# Patient Record
Sex: Female | Born: 1990 | Race: White | Hispanic: No | Marital: Single | Smoking: Former smoker
Health system: Southern US, Community
[De-identification: ages and names within clinical notes are randomized; demographics above are authoritative.]

## PROBLEM LIST (undated history)

## (undated) DIAGNOSIS — F32A Depression, unspecified: Secondary | ICD-10-CM

## (undated) DIAGNOSIS — A609 Anogenital herpesviral infection, unspecified: Secondary | ICD-10-CM

## (undated) DIAGNOSIS — F419 Anxiety disorder, unspecified: Secondary | ICD-10-CM

## (undated) DIAGNOSIS — O24419 Gestational diabetes mellitus in pregnancy, unspecified control: Secondary | ICD-10-CM

## (undated) HISTORY — DX: Anogenital herpesviral infection, unspecified: A60.9

## (undated) HISTORY — DX: Depression, unspecified: F32.A

## (undated) HISTORY — DX: Gestational diabetes mellitus in pregnancy, unspecified control: O24.419

## (undated) HISTORY — DX: Anxiety disorder, unspecified: F41.9

## (undated) HISTORY — PX: TONSILLECTOMY: SUR1361

---
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
## 2010-03-22 NOTE — Procedures (Signed)
Summary: Gastroenterology endo  Gastroenterology endo   Imported By: Donneta Romberg 05/05/2007 08:32:35  _____________________________________________________________________  External Attachment:    Type:   Image     Comment:   External Document

## 2010-07-03 NOTE — Assessment & Plan Note (Signed)
[facility]                         GASTROENTEROLOGY OFFICE NOTE   CYNTIA, STALEY                       MRN:          [id number]  DATE:11/24/2006                            DOB:          10/15/1990    PROBLEM:  Nausea.   Ms. Lemus is an 20 year old white female referred through the  courtesy of the physicians at [facility] for evaluation.  For at least  the last 7-10 days, she has been complaining of nausea, particularly  upon awakening.  She has had right epigastric discomfort with pyrosis  and stabbing pain left upper quadrant.  She was started on Prevacid with  some improvement in her symptoms, though they remain.  She denies  dysphagia.  She is on no gastric irritants with nonsteroidals.  With her  upper GI symptoms, she has had erratic bowels consisting of alternating  diarrhea and constipation.  There is no history of melena.  On one  occasion, she had some blood on the toilet tissue.   PAST MEDICAL HISTORY:  Pertinent for anxiety and depression.   FAMILY HISTORY:  Noncontributory.   MEDICATIONS:  Prevacid 30 mg a day.   ALLERGIES:  None.   SOCIAL HISTORY:  She neither smokes nor drinks.  She is single and lives  with her grandmother.   REVIEW OF SYSTEMS:  Positive for anxiety, shortness of breath, and  insomnia.   PHYSICAL EXAMINATION:  VITAL SIGNS:  Pulse 84, blood pressure 118/78,  weight 240.  HEENT:  EOMI. PERRLA.  Sclerae are anicteric. Conjunctivae are pink.  NECK:  Supple without thyromegaly, adenopathy or carotid bruits.  CHEST:  Clear to auscultation and percussion without adventitious  sounds.  CARDIAC:  Regular rhythm; normal S1 S2.  There are no murmurs, gallops  or rubs.  ABDOMEN:  Bowel sounds are normoactive.  Abdomen is soft, nontender and  nondistended.  There are no abdominal masses, tenderness, splenic  enlargement or hepatomegaly.  EXTREMITIES:  Full range of motion.  No cyanosis, clubbing or edema.  RECTAL:  Deferred.   IMPRESSION:  1. Nausea and dyspepsia.  This could be due to acid reflux.      Ulcerative and nonulcerative dyspepsia is another consideration.  2. Irritable bowel syndrome.   RECOMMENDATIONS:  1. Switch Prevacid to 30 mg 1/2 hour before dinnertime.  2. Upper endoscopy.     Barbette Hair. Arlyce Dice, MD,FACG  Electronically Signed    RDK/MedQ  DD: 11/24/2006  DT: 11/24/2006  Job #: [id number]

## 2011-03-12 ENCOUNTER — Ambulatory Visit (INDEPENDENT_AMBULATORY_CARE_PROVIDER_SITE_OTHER): Payer: 59

## 2011-03-12 DIAGNOSIS — S61409A Unspecified open wound of unspecified hand, initial encounter: Secondary | ICD-10-CM

## 2011-03-12 DIAGNOSIS — M79609 Pain in unspecified limb: Secondary | ICD-10-CM

## 2011-03-12 DIAGNOSIS — Z23 Encounter for immunization: Secondary | ICD-10-CM

## 2012-10-14 ENCOUNTER — Ambulatory Visit (INDEPENDENT_AMBULATORY_CARE_PROVIDER_SITE_OTHER): Payer: 59 | Admitting: Women's Health

## 2012-10-14 ENCOUNTER — Encounter: Payer: Self-pay | Admitting: Women's Health

## 2012-10-14 VITALS — BP 110/75 | Ht 65.0 in | Wt 244.0 lb

## 2012-10-14 DIAGNOSIS — Z113 Encounter for screening for infections with a predominantly sexual mode of transmission: Secondary | ICD-10-CM

## 2012-10-14 DIAGNOSIS — Z01419 Encounter for gynecological examination (general) (routine) without abnormal findings: Secondary | ICD-10-CM

## 2012-10-14 DIAGNOSIS — Z833 Family history of diabetes mellitus: Secondary | ICD-10-CM

## 2012-10-14 DIAGNOSIS — A499 Bacterial infection, unspecified: Secondary | ICD-10-CM

## 2012-10-14 DIAGNOSIS — N760 Acute vaginitis: Secondary | ICD-10-CM

## 2012-10-14 DIAGNOSIS — N898 Other specified noninflammatory disorders of vagina: Secondary | ICD-10-CM

## 2012-10-14 DIAGNOSIS — Z975 Presence of (intrauterine) contraceptive device: Secondary | ICD-10-CM

## 2012-10-14 DIAGNOSIS — B9689 Other specified bacterial agents as the cause of diseases classified elsewhere: Secondary | ICD-10-CM

## 2012-10-14 LAB — CBC WITH DIFFERENTIAL/PLATELET
Basophils Absolute: 0 10*3/uL (ref 0.0–0.1)
Basophils Relative: 0 % (ref 0–1)
Eosinophils Absolute: 0.2 10*3/uL (ref 0.0–0.7)
Eosinophils Relative: 2 % (ref 0–5)
HCT: 36.9 % (ref 36.0–46.0)
Hemoglobin: 12.7 g/dL (ref 12.0–15.0)
Lymphocytes Relative: 22 % (ref 12–46)
Lymphs Abs: 1.8 10*3/uL (ref 0.7–4.0)
MCH: 30.2 pg (ref 26.0–34.0)
MCHC: 34.4 g/dL (ref 30.0–36.0)
MCV: 87.9 fL (ref 78.0–100.0)
Monocytes Absolute: 0.6 10*3/uL (ref 0.1–1.0)
Monocytes Relative: 7 % (ref 3–12)
Neutro Abs: 5.8 10*3/uL (ref 1.7–7.7)
Neutrophils Relative %: 69 % (ref 43–77)
Platelets: 295 10*3/uL (ref 150–400)
RBC: 4.2 MIL/uL (ref 3.87–5.11)
RDW: 13.9 % (ref 11.5–15.5)
WBC: 8.4 10*3/uL (ref 4.0–10.5)

## 2012-10-14 LAB — WET PREP FOR TRICH, YEAST, CLUE
Trich, Wet Prep: NONE SEEN
Yeast Wet Prep HPF POC: NONE SEEN

## 2012-10-14 LAB — HEPATITIS B SURFACE ANTIGEN: Hepatitis B Surface Ag: NEGATIVE

## 2012-10-14 LAB — RPR

## 2012-10-14 LAB — HEPATITIS C ANTIBODY: HCV Ab: NEGATIVE

## 2012-10-14 LAB — GLUCOSE, RANDOM: Glucose, Bld: 105 mg/dL — ABNORMAL HIGH (ref 70–99)

## 2012-10-14 LAB — HIV ANTIBODY (ROUTINE TESTING W REFLEX): HIV: NONREACTIVE

## 2012-10-14 MED ORDER — METRONIDAZOLE 0.75 % VA GEL: VAGINAL | Status: DC

## 2012-10-14 NOTE — Progress Notes (Signed)
Rhona Fusilier 1991-01-31 [id number]    History:    New patient presents for annual exam and problem. States had left lower quadrant pain 2 weeks ago, resolved. Process of moving and wanted to be examined prior to move. Skyla IUD placed July 2013 at [facility]. Monthly cycle for 5 days, lighter since IUD. New partner. Unsure if received gardasil.   Past medical history, past surgical history, family history and social history were all reviewed and documented in the EPIC chart. Weight stable, obese. Parents healthy.  ROS:  A  ROS was performed and pertinent positives and negatives are included in the history.  Exam:  Filed Vitals:   10/14/12 1019  BP: 110/75    General appearance:  Normal Head/Neck:  Normal, without cervical or supraclavicular adenopathy. Thyroid:  Symmetrical, normal in size, without palpable masses or nodularity. Respiratory  Effort:  Normal  Auscultation:  Clear without wheezing or rhonchi Cardiovascular  Auscultation:  Regular rate, without rubs, murmurs or gallops  Edema/varicosities:  Not grossly evident Abdominal  Soft,nontender, without masses, guarding or rebound.  Liver/spleen:  No organomegaly noted  Hernia:  None appreciated  Skin  Inspection:  Grossly normal  Palpation:  Grossly normal Neurologic/psychiatric  Orientation:  Normal with appropriate conversation.  Mood/affect:  Normal  Genitourinary    Breasts: Examined lying and sitting.     Right: Without masses, retractions, discharge or axillary adenopathy.     Left: Without masses, retractions, discharge or axillary adenopathy.   Inguinal/mons:  Normal without inguinal adenopathy  External genitalia:  Normal  BUS/Urethra/Skene's glands:  Normal  Bladder:  Normal  Vagina: Wet prep positive for amines, clues, and TNTC bacteria  Cervix:  Normal IUD strings visible  Uterus:   normal in size, shape and contour.  Midline and mobile  Adnexa/parametria:     Rt: Without masses or  tenderness.   Lt: Without masses or tenderness.  Anus and perineum: Normal  Digital rectal exam: Normal sphincter tone without palpated masses or tenderness  Assessment/Plan:  22 y.o. S. WF G0 for annual exam.     Left lower quadrant pain resolved Skyla IUD placed 08/2011 Obesity STD screen Bacteria vaginosis  Plan: MetroGel vaginal cream 1 applicator at bedtime x5, alcohol precautions reviewed. Instructed to call or return if left lower quadrant pain returns. SBE's, calcium rich diet, MVI daily encouraged. CBC, glucose, UA, GC/Chlamydia, HIV, hep B, C., RPR. Pap normal 2013, new screening guidelines reviewed. Instructed to find out if she received gardasil return to office if needed. Reviewed importance of increasing exercise and decreasing calories for weight loss for health.     Harrington Challenger [facility], 10:52 AM 10/14/2012

## 2012-10-14 NOTE — Patient Instructions (Signed)

## 2012-10-15 LAB — GC/CHLAMYDIA PROBE AMP
CT Probe RNA: NEGATIVE
GC Probe RNA: NEGATIVE

## 2012-10-16 LAB — URINALYSIS W MICROSCOPIC + REFLEX CULTURE
Bacteria, UA: NONE SEEN
Bilirubin Urine: NEGATIVE
Casts: NONE SEEN
Crystals: NONE SEEN
Glucose, UA: NEGATIVE mg/dL
Hgb urine dipstick: NEGATIVE
Ketones, ur: NEGATIVE mg/dL
Leukocytes, UA: NEGATIVE
Nitrite: NEGATIVE
Protein, ur: NEGATIVE mg/dL
Specific Gravity, Urine: 1.023 (ref 1.005–1.030)
Squamous Epithelial / LPF: NONE SEEN
Urobilinogen, UA: 0.2 mg/dL (ref 0.0–1.0)
pH: 5.5 (ref 5.0–8.0)

## 2012-12-24 ENCOUNTER — Other Ambulatory Visit: Payer: Self-pay

## 2013-12-02 ENCOUNTER — Encounter: Payer: 59 | Admitting: Women's Health

## 2013-12-16 ENCOUNTER — Encounter: Payer: 59 | Admitting: Women's Health

## 2013-12-27 ENCOUNTER — Ambulatory Visit (INDEPENDENT_AMBULATORY_CARE_PROVIDER_SITE_OTHER): Payer: BC Managed Care – PPO | Admitting: Gynecology

## 2013-12-27 ENCOUNTER — Encounter: Payer: Self-pay | Admitting: Gynecology

## 2013-12-27 VITALS — BP 128/82

## 2013-12-27 DIAGNOSIS — R102 Pelvic and perineal pain: Secondary | ICD-10-CM

## 2013-12-27 DIAGNOSIS — Z87448 Personal history of other diseases of urinary system: Secondary | ICD-10-CM

## 2013-12-27 DIAGNOSIS — Z8742 Personal history of other diseases of the female genital tract: Secondary | ICD-10-CM

## 2013-12-27 DIAGNOSIS — Z113 Encounter for screening for infections with a predominantly sexual mode of transmission: Secondary | ICD-10-CM

## 2013-12-27 DIAGNOSIS — Z87898 Personal history of other specified conditions: Secondary | ICD-10-CM

## 2013-12-27 DIAGNOSIS — Z30432 Encounter for removal of intrauterine contraceptive device: Secondary | ICD-10-CM

## 2013-12-27 DIAGNOSIS — N898 Other specified noninflammatory disorders of vagina: Secondary | ICD-10-CM

## 2013-12-27 LAB — WET PREP FOR TRICH, YEAST, CLUE: Trich, Wet Prep: NONE SEEN

## 2013-12-27 MED ORDER — KETOROLAC TROMETHAMINE 10 MG PO TABS: 10.0000 mg | ORAL_TABLET | Freq: Four times a day (QID) | ORAL | Status: DC | PRN

## 2013-12-27 MED ORDER — FLUCONAZOLE 150 MG PO TABS: 150.0000 mg | ORAL_TABLET | Freq: Once | ORAL | Status: DC

## 2013-12-27 MED ORDER — KETOROLAC TROMETHAMINE 60 MG/2ML IM SOLN
60.0000 mg | Freq: Once | INTRAMUSCULAR | Status: AC
  Administered 2013-12-27: 60 mg via INTRAMUSCULAR

## 2013-12-27 MED ORDER — DOXYCYCLINE HYCLATE 100 MG PO CAPS: 100.0000 mg | ORAL_CAPSULE | Freq: Two times a day (BID) | ORAL | Status: DC

## 2013-12-27 NOTE — Progress Notes (Signed)
   Patient is a 23 year old who presented to the office today for follow-up. She had presented to [facility] on November 7 complaining of not being able to void. Patient stated that had done an ultrasound and then she was released home and then returned back she still couldn't void and they ended up putting a Foley catheter and was asked to follow-up with her gynecologist today and that's why she is here. We're having difficulty trying to obtain her medical records from her CT and ultrasound and lab results. The only information we are able to obtain was a CT scan which they report the following:  Radiology interpretation: 1. No renal calculi or hydronephrosis, Foley catheter urinary bladder 2. Shotty iliac chain lymph nodes with nonspecific mildly enlarged distal right external iliac lymphadenopathy, largest one measuring 2.2 x 1.5 cm. 3. IUD in place and no adnexal masses  They gave her Rocephin IM and start her on cephalexin 500 mg 4 times a day. Patient denies any fever, chills, nausea or vomiting. She did state that they did do a urine pregnancy test and was negative. She does have a Skla IUD that was placed 1-1/2 years ago and reports having normal menstrual cycle. She does state that she had a new sexual partner of 2 months.  Exam: Patient was fully Santina Evansatherine place Back: No CVA tenderness Abdomen: Soft nontender no rebound or guarding Negative Rovsing negative obturator negative heel tap sign Pelvic: Bartholin urethra Skene was within normal limits Vagina: No lesions or discharge Cervix: IUD string visualized Uterus: Anteverted normal size shape and consistency Adnexa: No palpable mass or tenderness Rectal exam deferred  Foley catheter was removed urine was submitted for culture  The IUD string was grasped with a Bo[place]eman clamp retrieved and discarded after showing to the patient.  Wet prep few yeast  Assessment/plan: Unclear of working diagnosis from  this weeks visit at the emergency room. Present working diagnosis with pelvic inflammatory disease. We did repeat a GC and Chlamydia culture today we are going to try to obtain the lab work that was done recently as well as the ultrasound. For broader coverage patient will be prescribed Vibramycin 100 mg one by mouth twice a day for 7 days. She received Toradol 60 mg IM in the office today and will continue with 10 mg oral tablet every 6 hours when necessary for the next few days. She'll return back to the office later this week for an ultrasound. If she is unable to void within the next 5-6 hours she'll report back to the emergency room. She had no clinical evidence of PID on exam today pelvic exam essentially unremarkable. The CT of the abdomen and pelvis the only description of the female genital tract was IUD was in the uterus and no adnexal masses were noted. Patient will abstain from intercourse until evaluations completed.

## 2013-12-27 NOTE — Patient Instructions (Signed)
Pelvic Inflammatory Disease °Pelvic inflammatory disease (PID) refers to an infection in some or all of the female organs. The infection can be in the uterus, ovaries, fallopian tubes, or the surrounding tissues in the pelvis. PID can cause abdominal or pelvic pain that comes on suddenly (acute pelvic pain). PID is a serious infection because it can lead to lasting (chronic) pelvic pain or the inability to have children (infertile).  °CAUSES  °The infection is often caused by the normal bacteria found in the vaginal tissues. PID may also be caused by an infection that is spread during sexual contact. PID can also occur following:  °· The birth of a baby.   °· A miscarriage.   °· An abortion.   °· Major pelvic surgery.   °· The use of an intrauterine device (IUD).   °· A sexual assault.   °RISK FACTORS °Certain factors can put a person at higher risk for PID, such as: °· Being younger than 25 years. °· Being sexually active at a young age. °· Using nonbarrier contraception. °· Having multiple sexual partners. °· Having sex with someone who has symptoms of a genital infection. °· Using oral contraception. °Other times, certain behaviors can increase the possibility of getting PID, such as: °· Having sex during your period. °· Using a vaginal douche. °· Having an intrauterine device (IUD) in place. °SYMPTOMS  °· Abdominal or pelvic pain.   °· Fever.   °· Chills.   °· Abnormal vaginal discharge. °· Abnormal uterine bleeding.   °· Unusual pain shortly after finishing your period. °DIAGNOSIS  °Your caregiver will choose some of the following methods to make a diagnosis, such as:  °· Performing a physical exam and history. A pelvic exam typically reveals a very tender uterus and surrounding pelvis.   °· Ordering laboratory tests including a pregnancy test, blood tests, and urine test.  °· Ordering cultures of the vagina and cervix to check for a sexually transmitted infection (STI). °· Performing an ultrasound.    °· Performing a laparoscopic procedure to look inside the pelvis.   °TREATMENT  °· Antibiotic medicines may be prescribed and taken by mouth.   °· Sexual partners may be treated when the infection is caused by a sexually transmitted disease (STD).   °· Hospitalization may be needed to give antibiotics intravenously. °· Surgery may be needed, but this is rare. °It may take weeks until you are completely well. If you are diagnosed with PID, you should also be checked for human immunodeficiency virus (HIV).   °HOME CARE INSTRUCTIONS  °· If given, take your antibiotics as directed. Finish the medicine even if you start to feel better.   °· Only take over-the-counter or prescription medicines for pain, discomfort, or fever as directed by your caregiver.   °· Do not have sexual intercourse until treatment is completed or as directed by your caregiver. If PID is confirmed, your recent sexual partner(s) will need treatment.   °· Keep your follow-up appointments. °SEEK MEDICAL CARE IF:  °· You have increased or abnormal vaginal discharge.   °· You need prescription medicine for your pain.   °· You vomit.   °· You cannot take your medicines.   °· Your partner has an STD.   °SEEK IMMEDIATE MEDICAL CARE IF:  °· You have a fever.   °· You have increased abdominal or pelvic pain.   °· You have chills.   °· You have pain when you urinate.   °· You are not better after 72 hours following treatment.   °MAKE SURE YOU:  °· Understand these instructions. °· Will watch your condition. °· Will get help right away if you are not doing well or get worse. °  Document Released: 02/04/2005 Document Revised: 06/01/2012 Document Reviewed: 01/31/2011 °Exit[employer]® Patient Information ©2015 [employer]. This information is not intended to replace advice given to you by your health care provider. Make sure you discuss any questions you have with your health care provider. ° °

## 2013-12-28 LAB — GC/CHLAMYDIA PROBE AMP
CT Probe RNA: NEGATIVE
GC Probe RNA: NEGATIVE

## 2013-12-29 ENCOUNTER — Other Ambulatory Visit: Payer: Self-pay | Admitting: Gynecology

## 2013-12-29 ENCOUNTER — Ambulatory Visit (INDEPENDENT_AMBULATORY_CARE_PROVIDER_SITE_OTHER): Payer: BC Managed Care – PPO | Admitting: Gynecology

## 2013-12-29 ENCOUNTER — Ambulatory Visit (INDEPENDENT_AMBULATORY_CARE_PROVIDER_SITE_OTHER): Payer: BC Managed Care – PPO

## 2013-12-29 ENCOUNTER — Encounter: Payer: Self-pay | Admitting: Gynecology

## 2013-12-29 DIAGNOSIS — Z87448 Personal history of other diseases of urinary system: Secondary | ICD-10-CM

## 2013-12-29 DIAGNOSIS — L680 Hirsutism: Secondary | ICD-10-CM

## 2013-12-29 DIAGNOSIS — Z87898 Personal history of other specified conditions: Secondary | ICD-10-CM

## 2013-12-29 DIAGNOSIS — R102 Pelvic and perineal pain: Secondary | ICD-10-CM

## 2013-12-29 DIAGNOSIS — R339 Retention of urine, unspecified: Secondary | ICD-10-CM

## 2013-12-29 DIAGNOSIS — E282 Polycystic ovarian syndrome: Secondary | ICD-10-CM

## 2013-12-29 DIAGNOSIS — Z8742 Personal history of other diseases of the female genital tract: Secondary | ICD-10-CM

## 2013-12-29 LAB — URINE CULTURE
Colony Count: NO GROWTH
Organism ID, Bacteria: NO GROWTH

## 2013-12-29 NOTE — Progress Notes (Signed)
   Patient presented to the office today for follow-up. She was seen in the office 48 hours ago see previous note dated November 9 for details. Her Foley catheter was removed. Patient's 12 well. She still taken her cephalexin but has not started the Vibramycin for which she will start today for completion of therapy for suspected PID based on the information that I got from this previous facilitating where she was seen. Her IUD was removed. She is totally asymptomatic today. Her recent GC and Chlamydia culture done in our office was negative. After Foley catheter was removed 2 days ago we ran a urine culture which was negative. She is here for ultrasound since we did not have an ultrasound report when she was in the emergency room [facility] on November 7.  Ultrasound today: Uterus measures 7.6 x 5.1 x 3.6 cm. Right and left ovary were normal. Otherwise unremarkable with no masses seen. Post void residual urine was noted.  Assessment/plan: Patient treated for suspected PID doing well. Patient is not going to be sexually active for the next few weeks. She will use para contraception alternative forms of contraception were discussed. Patient will otherwise return back to the office for annual exam or when necessary. If she has any further voiding problems she'll contact the office and we will refer to the urologist.

## 2013-12-29 NOTE — Patient Instructions (Signed)

## 2014-01-12 ENCOUNTER — Other Ambulatory Visit: Payer: Self-pay | Admitting: Gynecology

## 2014-01-31 ENCOUNTER — Other Ambulatory Visit: Payer: Self-pay | Admitting: Gynecology

## 2014-01-31 ENCOUNTER — Telehealth: Payer: Self-pay | Admitting: Gynecology

## 2014-01-31 DIAGNOSIS — Z30431 Encounter for routine checking of intrauterine contraceptive device: Secondary | ICD-10-CM

## 2014-01-31 NOTE — Telephone Encounter (Signed)
01/31/14-LM VM for pt [facility] the Nexplanon & insertion for contraception at 100%, no copay. Reminded pt that needs to be inserted while on cycle.wl

## 2014-02-01 ENCOUNTER — Encounter: Payer: BC Managed Care – PPO | Admitting: Gynecology

## 2015-03-17 DIAGNOSIS — O24419 Gestational diabetes mellitus in pregnancy, unspecified control: Secondary | ICD-10-CM

## 2016-07-03 ENCOUNTER — Encounter: Payer: Self-pay | Admitting: Gynecology

## 2017-03-24 ENCOUNTER — Encounter (HOSPITAL_COMMUNITY): Payer: Self-pay | Admitting: Emergency Medicine

## 2017-03-24 ENCOUNTER — Emergency Department (HOSPITAL_COMMUNITY): Payer: Self-pay

## 2017-03-24 ENCOUNTER — Emergency Department (HOSPITAL_COMMUNITY)
Admission: EM | Admit: 2017-03-24 | Discharge: 2017-03-24 | Disposition: A | Discharge status: Home / Self Care | Payer: Self-pay | Attending: Emergency Medicine | Admitting: Emergency Medicine

## 2017-03-24 DIAGNOSIS — Y929 Unspecified place or not applicable: Secondary | ICD-10-CM | POA: Insufficient documentation

## 2017-03-24 DIAGNOSIS — M5432 Sciatica, left side: Secondary | ICD-10-CM | POA: Insufficient documentation

## 2017-03-24 DIAGNOSIS — Y9389 Activity, other specified: Secondary | ICD-10-CM | POA: Insufficient documentation

## 2017-03-24 DIAGNOSIS — S46812A Strain of other muscles, fascia and tendons at shoulder and upper arm level, left arm, initial encounter: Secondary | ICD-10-CM | POA: Insufficient documentation

## 2017-03-24 DIAGNOSIS — Z79899 Other long term (current) drug therapy: Secondary | ICD-10-CM | POA: Insufficient documentation

## 2017-03-24 DIAGNOSIS — F17210 Nicotine dependence, cigarettes, uncomplicated: Secondary | ICD-10-CM | POA: Insufficient documentation

## 2017-03-24 DIAGNOSIS — Y999 Unspecified external cause status: Secondary | ICD-10-CM | POA: Insufficient documentation

## 2017-03-24 DIAGNOSIS — S239XXA Sprain of unspecified parts of thorax, initial encounter: Secondary | ICD-10-CM | POA: Insufficient documentation

## 2017-03-24 DIAGNOSIS — S43402A Unspecified sprain of left shoulder joint, initial encounter: Secondary | ICD-10-CM

## 2017-03-24 MED ORDER — METHOCARBAMOL 500 MG PO TABS
750.0000 mg | ORAL_TABLET | Freq: Once | ORAL | Status: AC
  Administered 2017-03-24: 750 mg via ORAL
  Filled 2017-03-24: qty 2

## 2017-03-24 MED ORDER — KETOROLAC TROMETHAMINE 30 MG/ML IJ SOLN
30.0000 mg | Freq: Once | INTRAMUSCULAR | Status: AC
  Administered 2017-03-24: 30 mg via INTRAMUSCULAR
  Filled 2017-03-24: qty 1

## 2017-03-24 MED ORDER — METHOCARBAMOL 500 MG PO TABS: 500.0000 mg | ORAL_TABLET | Freq: Two times a day (BID) | ORAL | 0 refills | Status: DC

## 2017-03-24 MED ORDER — MELOXICAM 15 MG PO TABS: 15.0000 mg | ORAL_TABLET | Freq: Every day | ORAL | 0 refills | Status: DC

## 2017-03-24 NOTE — ED Notes (Signed)
ED Provider at bedside. 
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
## 2017-03-24 NOTE — ED Notes (Signed)
Patient transported to X-ray 

## 2017-03-24 NOTE — Discharge Instructions (Signed)
Try ice/heat. Mobic for pain and inflammation. Robaxin for muscle spasms. Rest however avoid staying in bed/couch for prolonged period of time, it may make you stiffer. Please follow up with family doctor or orthopedics specialist if symptoms do not improve in 5-7 days. Return if worsening.
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
## 2017-03-24 NOTE — ED Notes (Signed)
Bed: [facility]1 Expected date:  Expected time:  Means of arrival:  Comments: 

## 2018-12-07 LAB — OB RESULTS CONSOLE ABO/RH: RH Type: POSITIVE

## 2018-12-07 LAB — OB RESULTS CONSOLE RPR: RPR: NONREACTIVE

## 2018-12-07 LAB — OB RESULTS CONSOLE GC/CHLAMYDIA
Chlamydia: NEGATIVE
Gonorrhea: NEGATIVE

## 2018-12-07 LAB — OB RESULTS CONSOLE HIV ANTIBODY (ROUTINE TESTING): HIV: NONREACTIVE

## 2018-12-07 LAB — OB RESULTS CONSOLE ANTIBODY SCREEN: Antibody Screen: NEGATIVE

## 2018-12-07 LAB — OB RESULTS CONSOLE RUBELLA ANTIBODY, IGM: Rubella: IMMUNE

## 2018-12-07 LAB — OB RESULTS CONSOLE HEPATITIS B SURFACE ANTIGEN: Hepatitis B Surface Ag: NEGATIVE

## 2019-02-19 NOTE — L&D Delivery Note (Signed)
Delivery Note At 11:06 PM a viable female was delivered via Vaginal, Spontaneous (Presentation:   Occiput Anterior).  APGAR: good cry/ tone, , ; weight  .   Placenta status: Spontaneous, Intact.  Cord: 3 vessels with the following complications: None.  Cord pH: n/a  Several deep variables over last several pushes with adequate recovery. Quick delivery of head, double nuchal cord noted, one reduced but baby delivered spontaneously befor 2nd nuchal could be reduced; baby rotated during delivery to relieve nuchal cord. Shoulders followed quickly.   Anesthesia: Epidural Episiotomy: None Lacerations: None Suture Repair: na Est. Blood Loss (mL): 100  Mom to postpartum.  Baby to Couplet care / Skin to Skin.  Lendon Colonel 07/26/2019, 11:15 PM

## 2019-07-06 LAB — OB RESULTS CONSOLE GBS: GBS: POSITIVE

## 2019-07-22 ENCOUNTER — Encounter (HOSPITAL_COMMUNITY): Payer: Self-pay | Admitting: *Deleted

## 2019-07-22 ENCOUNTER — Telehealth (HOSPITAL_COMMUNITY): Payer: Self-pay | Admitting: *Deleted

## 2019-07-22 NOTE — Telephone Encounter (Signed)
Preadmission screen  

## 2019-07-23 ENCOUNTER — Other Ambulatory Visit: Payer: Self-pay | Admitting: Obstetrics

## 2019-07-24 ENCOUNTER — Other Ambulatory Visit (HOSPITAL_COMMUNITY)
Admission: RE | Admit: 2019-07-24 | Discharge: 2019-07-24 | Disposition: A | Discharge status: Home / Self Care | Payer: Medicaid Other | Source: Ambulatory Visit | Attending: Obstetrics | Admitting: Obstetrics

## 2019-07-24 DIAGNOSIS — Z20822 Contact with and (suspected) exposure to covid-19: Secondary | ICD-10-CM | POA: Insufficient documentation

## 2019-07-24 DIAGNOSIS — Z01812 Encounter for preprocedural laboratory examination: Secondary | ICD-10-CM | POA: Insufficient documentation

## 2019-07-24 LAB — SARS CORONAVIRUS 2 (TAT 6-24 HRS): SARS Coronavirus 2: NEGATIVE

## 2019-07-25 NOTE — Progress Notes (Signed)
 Kayla Mcdowell is a 29 y.o. 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 Pt notes rare contractions. Good fetal movement, No vaginal bleeding, not leaking fluid. Pt notes over the past week increase in HA, diastolic bp's into the low 90s on 2 visits last week and decision to proceed with IOL. Labs 6/3 negative for PEC.     Pt admitted in am, pit 2x2 with PCN started. Slow to achieve active labor, increase in ctx after AROM and  Epidural placed.      P[facility]are at [facility] since 7 wks  - Dated by LMP c/w 7 wk u/s  - 2VC, nl fetal echo, nl fetal growth  - h/o GDM, early DS of 115, at 28 wks was unable to tolerate GTT due to emesis, started checkin home BS, elevated values, mostly FBS and slow titration of Lantus up to 28 untils qhs. A1/c on 07/22/19 was 5.6.   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 Low risk Panorama  - GBS positive.          Prenatal Transfer Tool    Maternal Diabetes: Yes:  Diabetes Type:  Insulin/Medication controlled  Genetic Screening: Normal  Maternal Ultrasounds/Referrals: Normal  Fetal Ultrasounds or other Referrals:  Fetal echo  Maternal Substance Abuse:  No  Significant Maternal Medications:  None  Significant Maternal Lab Results: Group B Strep positive                       OB History            Gravida    2        Para    1        Term             Preterm             AB             Living                   SAB             TAB             Ectopic             Multiple             Live Births                                 Past Medical History:    Diagnosis   Date    .   Anxiety        .   Gestational diabetes            insulin             Past Surgical History:     Procedure   Laterality   Date    .   TONSILLECTOMY                HAD DONE AT 1 YEARS       Family History: family history includes Hypertension in her maternal grandmother.  Social History:  reports that she has been smoking cigarettes. She has been smoking about 0.50 packs per day. She has never used smokeless tobacco. She reports current alcohol use. She reports that she   does not use drugs.     Review of Systems - Negative except discomfort of pregnancy        Physical Exam:    Vitals with BMI   07/26/2019   07/26/2019   07/26/2019    Height   -   -   -    Weight   -   -   -    BMI   -   -   -    Systolic   113   129   119    Diastolic   48   72   47    Pulse   75   71   71       Toco: IUPC placed, q 3 min, MVU adequate  Cvx: 3/60%/vtx -2 per CNM     Prenatal labs:  ABO, Rh: O/Positive/-- (10/19 0000)  Antibody: Negative (10/19 0000)  Rubella: Immune (10/19 0000)  RPR: Nonreactive (10/19 0000)   HBsAg: Negative (10/19 0000)   HIV: Non-reactive (10/19 0000)   GBS: Positive/-- (05/18 0000)   1 hr Glucola early DS 115, unable to tolerated 1 hr GTT at 28 wks    Genetic screening normal Panorama  Anatomy US normal        Assessment/Plan: 29 y.o. G2P1 at [redacted]w[redacted]d.  - gest htn. Plan IOL due to increasing HA and dbp climbing into 90s. Plan PIH labs on admit, follow bps closely  -IDDM, GDM. Pt instructed to decrease pm Lantus on night prior to admission, will follow BS q2hr while in active labor  - prior SVD, plan pit 2x2, AROM when able  - alert peds to baby risk of premature adrenarche  - GBS pos, plan PCN with labor  - slow to achieve active labor, adequate MVU but given only 3 cm will cont pitocin to work toward vaginal delivery in this multiparous patient.     Kayla Mcdowell A Kayla Mcdowell  07/26/2019 7:40 PM                  

## 2019-07-26 ENCOUNTER — Inpatient Hospital Stay (HOSPITAL_COMMUNITY): Payer: Medicaid Other | Admitting: Anesthesiology

## 2019-07-26 ENCOUNTER — Other Ambulatory Visit: Payer: Self-pay

## 2019-07-26 ENCOUNTER — Inpatient Hospital Stay (HOSPITAL_COMMUNITY): Payer: Medicaid Other

## 2019-07-26 ENCOUNTER — Inpatient Hospital Stay (HOSPITAL_COMMUNITY)
Admission: AD | Admit: 2019-07-26 | Discharge: 2019-07-28 | DRG: 807 | Disposition: A | Discharge status: Home / Self Care | Payer: Medicaid Other | Attending: Obstetrics | Admitting: Obstetrics

## 2019-07-26 ENCOUNTER — Encounter (HOSPITAL_COMMUNITY): Payer: Self-pay | Admitting: Obstetrics

## 2019-07-26 DIAGNOSIS — O134 Gestational [pregnancy-induced] hypertension without significant proteinuria, complicating childbirth: Principal | ICD-10-CM | POA: Diagnosis present

## 2019-07-26 DIAGNOSIS — F419 Anxiety disorder, unspecified: Secondary | ICD-10-CM | POA: Diagnosis present

## 2019-07-26 DIAGNOSIS — O24424 Gestational diabetes mellitus in childbirth, insulin controlled: Secondary | ICD-10-CM | POA: Diagnosis present

## 2019-07-26 DIAGNOSIS — O24419 Gestational diabetes mellitus in pregnancy, unspecified control: Secondary | ICD-10-CM

## 2019-07-26 DIAGNOSIS — O99334 Smoking (tobacco) complicating childbirth: Secondary | ICD-10-CM | POA: Diagnosis present

## 2019-07-26 DIAGNOSIS — O139 Gestational [pregnancy-induced] hypertension without significant proteinuria, unspecified trimester: Secondary | ICD-10-CM

## 2019-07-26 DIAGNOSIS — O99344 Other mental disorders complicating childbirth: Secondary | ICD-10-CM | POA: Diagnosis present

## 2019-07-26 DIAGNOSIS — F329 Major depressive disorder, single episode, unspecified: Secondary | ICD-10-CM | POA: Diagnosis present

## 2019-07-26 DIAGNOSIS — F17210 Nicotine dependence, cigarettes, uncomplicated: Secondary | ICD-10-CM | POA: Diagnosis present

## 2019-07-26 DIAGNOSIS — O99824 Streptococcus B carrier state complicating childbirth: Secondary | ICD-10-CM | POA: Diagnosis present

## 2019-07-26 DIAGNOSIS — Z3A37 37 weeks gestation of pregnancy: Secondary | ICD-10-CM

## 2019-07-26 DIAGNOSIS — Z3490 Encounter for supervision of normal pregnancy, unspecified, unspecified trimester: Secondary | ICD-10-CM | POA: Diagnosis present

## 2019-07-26 DIAGNOSIS — O99214 Obesity complicating childbirth: Secondary | ICD-10-CM | POA: Diagnosis present

## 2019-07-26 LAB — TYPE AND SCREEN
ABO/RH(D): O POS
Antibody Screen: NEGATIVE

## 2019-07-26 LAB — COMPREHENSIVE METABOLIC PANEL
ALT: 16 U/L (ref 0–44)
AST: 38 U/L (ref 15–41)
Albumin: 2.7 g/dL — ABNORMAL LOW (ref 3.5–5.0)
Alkaline Phosphatase: 92 U/L (ref 38–126)
Anion gap: 9 (ref 5–15)
BUN: 14 mg/dL (ref 6–20)
CO2: 19 mmol/L — ABNORMAL LOW (ref 22–32)
Calcium: 9.2 mg/dL (ref 8.9–10.3)
Chloride: 107 mmol/L (ref 98–111)
Creatinine, Ser: 0.88 mg/dL (ref 0.44–1.00)
GFR calc Af Amer: >60 mL/min (ref >60)
GFR calc non Af Amer: >60 mL/min (ref >60)
Glucose, Bld: 108 mg/dL — ABNORMAL HIGH (ref 70–99)
Potassium: 4.7 mmol/L (ref 3.5–5.1)
Sodium: 135 mmol/L (ref 135–145)
Total Bilirubin: 0.5 mg/dL (ref 0.3–1.2)
Total Protein: 4.9 g/dL — ABNORMAL LOW (ref 6.5–8.1)

## 2019-07-26 LAB — CBC
HCT: 39.1 % (ref 36.0–46.0)
Hemoglobin: 12.8 g/dL (ref 12.0–15.0)
MCH: 30.8 pg (ref 26.0–34.0)
MCHC: 32.7 g/dL (ref 30.0–36.0)
MCV: 94.2 fL (ref 80.0–100.0)
Platelets: 214 10*3/uL (ref 150–400)
RBC: 4.15 MIL/uL (ref 3.87–5.11)
RDW: 13.8 % (ref 11.5–15.5)
WBC: 7.8 10*3/uL (ref 4.0–10.5)
nRBC: 0 % (ref 0.0–0.2)

## 2019-07-26 LAB — RPR: RPR Ser Ql: NONREACTIVE

## 2019-07-26 LAB — GLUCOSE, CAPILLARY
Glucose-Capillary: 103 mg/dL — ABNORMAL HIGH (ref 70–99)
Glucose-Capillary: 74 mg/dL (ref 70–99)
Glucose-Capillary: 75 mg/dL (ref 70–99)
Glucose-Capillary: 72 mg/dL (ref 70–99)

## 2019-07-26 LAB — URIC ACID: Uric Acid, Serum: 6.1 mg/dL (ref 2.5–7.1)

## 2019-07-26 LAB — ABO/RH: ABO/RH(D): O POS

## 2019-07-26 MED ORDER — ACETAMINOPHEN 325 MG PO TABS: 650.0000 mg | ORAL_TABLET | ORAL | Status: DC | PRN

## 2019-07-26 MED ORDER — LIDOCAINE HCL (PF) 1 % IJ SOLN: 30.0000 mL | INTRAMUSCULAR | Status: DC | PRN

## 2019-07-26 MED ORDER — LACTATED RINGERS IV SOLN
500.0000 mL | Freq: Once | INTRAVENOUS | Status: AC
  Administered 2019-07-26: 500 mL via INTRAVENOUS

## 2019-07-26 MED ORDER — FENTANYL-BUPIVACAINE-NACL 0.5-0.125-0.9 MG/250ML-% EP SOLN
12.0000 mL/h | EPIDURAL | Status: DC | PRN
  Filled 2019-07-26: qty 250

## 2019-07-26 MED ORDER — SODIUM CHLORIDE 0.9 % IV SOLN
5.0000 10*6.[IU] | Freq: Once | INTRAVENOUS | Status: AC
  Administered 2019-07-26: 5 10*6.[IU] via INTRAVENOUS
  Filled 2019-07-26: qty 5

## 2019-07-26 MED ORDER — OXYTOCIN-SODIUM CHLORIDE 30-0.9 UT/500ML-% IV SOLN
1.0000 m[IU]/min | INTRAVENOUS | Status: DC
  Administered 2019-07-26: 2 m[IU]/min via INTRAVENOUS
  Filled 2019-07-26: qty 500

## 2019-07-26 MED ORDER — LACTATED RINGERS IV SOLN: 500.0000 mL | Freq: Once | INTRAVENOUS | Status: DC

## 2019-07-26 MED ORDER — SODIUM CHLORIDE (PF) 0.9 % IJ SOLN
INTRAMUSCULAR | Status: DC | PRN
  Administered 2019-07-26: 12 mL/h via EPIDURAL

## 2019-07-26 MED ORDER — LACTATED RINGERS IV SOLN: INTRAVENOUS | Status: DC

## 2019-07-26 MED ORDER — SOD CITRATE-CITRIC ACID 500-334 MG/5ML PO SOLN: 30.0000 mL | ORAL | Status: DC | PRN

## 2019-07-26 MED ORDER — EPHEDRINE 5 MG/ML INJ: 10.0000 mg | INTRAVENOUS | Status: DC | PRN

## 2019-07-26 MED ORDER — TERBUTALINE SULFATE 1 MG/ML IJ SOLN: 0.2500 mg | Freq: Once | INTRAMUSCULAR | Status: DC | PRN

## 2019-07-26 MED ORDER — LIDOCAINE HCL (PF) 1 % IJ SOLN
INTRAMUSCULAR | Status: DC | PRN
  Administered 2019-07-26: 2 mL via EPIDURAL
  Administered 2019-07-26: 5 mL via EPIDURAL
  Administered 2019-07-26: 3 mL via EPIDURAL

## 2019-07-26 MED ORDER — ONDANSETRON HCL 4 MG/2ML IJ SOLN
4.0000 mg | Freq: Four times a day (QID) | INTRAMUSCULAR | Status: DC | PRN
  Administered 2019-07-26: 4 mg via INTRAVENOUS
  Filled 2019-07-26: qty 2

## 2019-07-26 MED ORDER — LACTATED RINGERS IV SOLN: 500.0000 mL | INTRAVENOUS | Status: DC | PRN

## 2019-07-26 MED ORDER — DIPHENHYDRAMINE HCL 50 MG/ML IJ SOLN: 12.5000 mg | INTRAMUSCULAR | Status: DC | PRN

## 2019-07-26 MED ORDER — PHENYLEPHRINE 40 MCG/ML (10ML) SYRINGE FOR IV PUSH (FOR BLOOD PRESSURE SUPPORT): 80.0000 ug | PREFILLED_SYRINGE | INTRAVENOUS | Status: DC | PRN

## 2019-07-26 MED ORDER — OXYTOCIN-SODIUM CHLORIDE 30-0.9 UT/500ML-% IV SOLN
2.5000 [IU]/h | INTRAVENOUS | Status: DC
  Administered 2019-07-26: 2.5 [IU]/h via INTRAVENOUS
  Filled 2019-07-26: qty 500

## 2019-07-26 MED ORDER — PENICILLIN G POT IN DEXTROSE 60000 UNIT/ML IV SOLN
3.0000 10*6.[IU] | INTRAVENOUS | Status: DC
  Administered 2019-07-26 (×3): 3 10*6.[IU] via INTRAVENOUS
  Filled 2019-07-26 (×3): qty 50

## 2019-07-26 MED ORDER — OXYTOCIN BOLUS FROM INFUSION
500.0000 mL | Freq: Once | INTRAVENOUS | Status: AC
  Administered 2019-07-26: 500 mL via INTRAVENOUS

## 2019-07-26 NOTE — Anesthesia Procedure Notes (Signed)
Epidural Patient location during procedure: OB Start time: 07/26/2019 2:45 PM End time: 07/26/2019 2:53 PM  Staffing Anesthesiologist: Cecile Hearing, MD Performed: anesthesiologist   Preanesthetic Checklist Completed: patient identified, IV checked, risks and benefits discussed, monitors and equipment checked, pre-op evaluation and timeout performed  Epidural Patient position: sitting Prep: DuraPrep Patient monitoring: blood pressure and continuous pulse ox Approach: midline Location: L3-L4 Injection technique: LOR air  Needle:  Needle type: Tuohy  Needle gauge: 17 G Needle length: 9 cm Needle insertion depth: 7 cm Catheter size: 19 Gauge Catheter at skin depth: 13 cm Test dose: negative and Other (1% Lidocaine)  Additional Notes Patient identified.  Risk benefits discussed including failed block, incomplete pain control, headache, nerve damage, paralysis, blood pressure changes, nausea, vomiting, reactions to medication both toxic or allergic, and postpartum back pain.  Patient expressed understanding and wished to proceed.  All questions were answered.  Sterile technique used throughout procedure and epidural site dressed with sterile barrier dressing. No paresthesia or other complications noted. The patient did not experience any signs of intravascular injection such as tinnitus or metallic taste in mouth nor signs of intrathecal spread such as rapid motor block. Please see nursing notes for vital signs. Reason for block:procedure for pain

## 2019-07-26 NOTE — Progress Notes (Signed)
S: Doing well, no complaints, pain well controlled with epidural. Only mild frontal HA  O: BP 121/61   Pulse 72   Temp 97.6 F (36.4 C) (Oral)   Resp 16   Ht 5' 4.5" (1.638 m)   Wt 131.2 kg   SpO2 99%   BMI 48.87 kg/m    FHT:  FHR: 145s bpm, variability: moderate,  accelerations:  Present,  decelerations:  Absent UC:   regular, every 3 minutes SVE:   Dilation: 4 Effacement (%): 80 Station: -2 Exam by:: Dr.Foggleman    A / P:  29 y.o.  OB History  Gravida Para Term Preterm AB Living  2 1 0 0 0 0  SAB TAB Ectopic Multiple Live Births  0 0 0 0 0   at [redacted]w[redacted]d IOL, gest htn, no evidence PEC. GDM. slow labor progress, prior SVD, AGA. cont pitocin, BS normal q 4 hrs. GBS pos, on PCN.   Fetal Wellbeing:  Category I Pain Control:  Epidural  Anticipated MOD:  NSVD  Kayla Mcdowell 07/26/2019, 9:21 PM

## 2019-07-26 NOTE — Anesthesia Preprocedure Evaluation (Signed)
Anesthesia Evaluation  Patient identified by MRN, date of birth, ID band Patient awake    Reviewed: Allergy & Precautions, NPO status , Patient's Chart, lab work & pertinent test results  Airway Mallampati: II  TM Distance: >3 FB Neck ROM: Full    Dental  (+) Teeth Intact, Dental Advisory Given   Pulmonary Current Smoker and Patient abstained from smoking.,    Pulmonary exam normal breath sounds clear to auscultation       Cardiovascular negative cardio ROS Normal cardiovascular exam Rhythm:Regular Rate:Normal     Neuro/Psych PSYCHIATRIC DISORDERS Anxiety Depression negative neurological ROS     GI/Hepatic Neg liver ROS, GERD  Medicated,  Endo/Other  diabetes, Gestational, Insulin DependentMorbid obesity  Renal/GU negative Renal ROS     Musculoskeletal negative musculoskeletal ROS (+)   Abdominal   Peds  Hematology negative hematology ROS (+) PLT 214K   Anesthesia Other Findings Day of surgery medications reviewed with the patient.  Reproductive/Obstetrics                             Anesthesia Physical Anesthesia Plan  ASA: III  Anesthesia Plan: Epidural   Post-op Pain Management:    Induction:   PONV Risk Score and Plan: 1 and Treatment may vary due to age or medical condition  Airway Management Planned: Natural Airway  Additional Equipment:   Intra-op Plan:   Post-operative Plan:   Informed Consent: I have reviewed the patients History and Physical, chart, labs and discussed the procedure including the risks, benefits and alternatives for the proposed anesthesia with the patient or authorized representative who has indicated his/her understanding and acceptance.     Dental advisory given  Plan Discussed with:   Anesthesia Plan Comments: (Patient identified. Risks/Benefits/Options discussed with patient including but not limited to bleeding, infection, nerve damage,  paralysis, failed block, incomplete pain control, headache, blood pressure changes, nausea, vomiting, reactions to medication both or allergic, itching and postpartum back pain. Confirmed with bedside nurse the patient's most recent platelet count. Confirmed with patient that they are not currently taking any anticoagulation, have any bleeding history or any family history of bleeding disorders. Patient expressed understanding and wished to proceed. All questions were answered. )        Anesthesia Quick Evaluation

## 2019-07-26 NOTE — Progress Notes (Signed)
Late entry note: Asked by MD to assess for AROM at 1330, and then IUPC placement at 1820.  S:  Pt. Comfortable with epidural. Discussed plan and pt. Agrees.  O:  On 18 milliunits of Pitocin VS: Blood pressure (!) 110/56, pulse 89, temperature 98.8 F (37.1 C), temperature source Oral, resp. rate 18, height 5' 4.5" (1.638 m), weight 131.2 kg, SpO2 100 %.  Patient Vitals for the past 24 hrs:  BP Temp Temp src Pulse Resp SpO2 Height Weight  07/26/19 1825 -- -- -- -- -- 99 % -- --  07/26/19 1824 (!) 119/47 -- -- 71 -- -- -- --  07/26/19 1801 (!) 110/56 -- -- 89 -- -- -- --  07/26/19 1732 111/78 -- -- 86 -- -- -- --  07/26/19 1701 136/76 -- -- 72 -- -- -- --  07/26/19 1631 139/75 -- -- 71 -- -- -- --  07/26/19 1601 (!) 141/80 -- -- 66 -- -- -- --  07/26/19 1535 (!) 142/82 -- -- 68 -- 100 % -- --  07/26/19 1531 (!) 146/80 -- -- 69 -- -- -- --  07/26/19 1526 (!) 144/75 -- -- 68 -- -- -- --  07/26/19 1521 137/78 -- -- 72 -- -- -- --  07/26/19 1516 134/76 -- -- 72 18 -- -- --  07/26/19 1511 (!) 145/71 -- -- 71 -- -- -- --  07/26/19 1506 135/61 -- -- 66 -- -- -- --  07/26/19 1501 131/83 -- -- 72 -- -- -- --  07/26/19 1500 131/83 -- -- 72 -- -- -- --  07/26/19 1457 114/84 -- -- 76 -- -- -- --  07/26/19 1454 135/84 -- -- 79 -- -- -- --  07/26/19 1453 135/84 -- -- 79 -- -- -- --  07/26/19 1436 (!) 136/95 -- -- 72 -- -- -- --  07/26/19 1401 (!) 149/92 98.8 F (37.1 C) Oral 82 17 -- -- --  07/26/19 1205 140/85 -- -- 73 17 -- -- --  07/26/19 1111 (!) 152/98 -- -- 72 16 -- -- --  07/26/19 1007 133/72 -- -- 73 17 -- -- --  07/26/19 0907 140/84 (!) 97.5 F (36.4 C) Oral 90 16 -- -- --  07/26/19 0828 -- -- -- -- -- -- 5' 4.5" (1.638 m) 131.2 kg          FHR : baseline 135 bpm / variability moderate / accelerations + / no decelerations        Toco: contractions every 2-4 minutes / moderate/ difficulty assessing due body habitus        Cervix : 3.5cm/70%/-2/vtx        Membranes: clear fluid  A:  Latent labor     FHR category 1     GBS positive - s/p PCN x 2 doses     A2GDM     GHTN - no evidence of PEC  P: Pitocin augmentation to achieve adequate contractions. MD to continue to follow    Carlean Jews, MSN, CNM [facility]

## 2019-07-27 DIAGNOSIS — O24419 Gestational diabetes mellitus in pregnancy, unspecified control: Secondary | ICD-10-CM

## 2019-07-27 LAB — CBC
HCT: 38.1 % (ref 36.0–46.0)
Hemoglobin: 12.7 g/dL (ref 12.0–15.0)
MCH: 31 pg (ref 26.0–34.0)
MCHC: 33.3 g/dL (ref 30.0–36.0)
MCV: 92.9 fL (ref 80.0–100.0)
Platelets: 196 10*3/uL (ref 150–400)
RBC: 4.1 MIL/uL (ref 3.87–5.11)
RDW: 13.8 % (ref 11.5–15.5)
WBC: 16.3 10*3/uL — ABNORMAL HIGH (ref 4.0–10.5)
nRBC: 0 % (ref 0.0–0.2)

## 2019-07-27 LAB — GLUCOSE, CAPILLARY
Glucose-Capillary: 121 mg/dL — ABNORMAL HIGH (ref 70–99)
Glucose-Capillary: 95 mg/dL (ref 70–99)
Glucose-Capillary: 127 mg/dL — ABNORMAL HIGH (ref 70–99)

## 2019-07-27 MED ORDER — PRENATAL MULTIVITAMIN CH
1.0000 | ORAL_TABLET | Freq: Every day | ORAL | Status: DC
  Administered 2019-07-27 – 2019-07-28 (×2): 1 via ORAL
  Filled 2019-07-27 (×2): qty 1

## 2019-07-27 MED ORDER — ONDANSETRON HCL 4 MG/2ML IJ SOLN: 4.0000 mg | INTRAMUSCULAR | Status: DC | PRN

## 2019-07-27 MED ORDER — BENZOCAINE-MENTHOL 20-0.5 % EX AERO: 1.0000 "application " | INHALATION_SPRAY | CUTANEOUS | Status: DC | PRN

## 2019-07-27 MED ORDER — DIPHENHYDRAMINE HCL 25 MG PO CAPS: 25.0000 mg | ORAL_CAPSULE | Freq: Four times a day (QID) | ORAL | Status: DC | PRN

## 2019-07-27 MED ORDER — OXYCODONE HCL 5 MG PO TABS: 10.0000 mg | ORAL_TABLET | ORAL | Status: DC | PRN

## 2019-07-27 MED ORDER — SENNOSIDES-DOCUSATE SODIUM 8.6-50 MG PO TABS
2.0000 | ORAL_TABLET | ORAL | Status: DC
  Administered 2019-07-28: 2 via ORAL
  Filled 2019-07-27: qty 2

## 2019-07-27 MED ORDER — ZOLPIDEM TARTRATE 5 MG PO TABS: 5.0000 mg | ORAL_TABLET | Freq: Every evening | ORAL | Status: DC | PRN

## 2019-07-27 MED ORDER — TETANUS-DIPHTH-ACELL PERTUSSIS 5-2.5-18.5 LF-MCG/0.5 IM SUSP: 0.5000 mL | Freq: Once | INTRAMUSCULAR | Status: DC

## 2019-07-27 MED ORDER — WITCH HAZEL-GLYCERIN EX PADS: 1.0000 "application " | MEDICATED_PAD | CUTANEOUS | Status: DC | PRN

## 2019-07-27 MED ORDER — SIMETHICONE 80 MG PO CHEW: 80.0000 mg | CHEWABLE_TABLET | ORAL | Status: DC | PRN

## 2019-07-27 MED ORDER — OXYCODONE HCL 5 MG PO TABS: 5.0000 mg | ORAL_TABLET | ORAL | Status: DC | PRN

## 2019-07-27 MED ORDER — COCONUT OIL OIL: 1.0000 "application " | TOPICAL_OIL | Status: DC | PRN

## 2019-07-27 MED ORDER — DIBUCAINE (PERIANAL) 1 % EX OINT: 1.0000 "application " | TOPICAL_OINTMENT | CUTANEOUS | Status: DC | PRN

## 2019-07-27 MED ORDER — IBUPROFEN 600 MG PO TABS
600.0000 mg | ORAL_TABLET | Freq: Four times a day (QID) | ORAL | Status: DC
  Administered 2019-07-27 – 2019-07-28 (×6): 600 mg via ORAL
  Filled 2019-07-27 (×7): qty 1

## 2019-07-27 MED ORDER — ONDANSETRON HCL 4 MG PO TABS: 4.0000 mg | ORAL_TABLET | ORAL | Status: DC | PRN

## 2019-07-27 MED ORDER — ACETAMINOPHEN 325 MG PO TABS: 650.0000 mg | ORAL_TABLET | ORAL | Status: DC | PRN

## 2019-07-27 NOTE — Lactation Note (Signed)
This note was copied from a baby's chart. Lactation Consultation Note  Patient Name: Kayla Mcdowell [id number] Date: 07/27/2019 Reason for consult: Initial assessment;Early term 37-38.6wks;Maternal endocrine disorder Type of Endocrine Disorder?: Diabetes P2, 4 hour ETI female infant, slightly  less than 6 lbs. Infant hod one stool since birth. Per mom, she feels infant has been latching well at breast, first feeding was 5 minutes and 2nd feeding was 10 minutes. LC entered room, mom was slumped over attempting to breastfeed infant without pillow support and infant was on mom's nipple tip. Infant was swaddle in multiple blankets .  LC gave mom pillows to support infant and herself, infant was un-swaddle so mom could breastfeed infant doing STS at breast. LC asked mom to sit back in sitting position and bring infant towards her chest wall. Mom latched infant on her right breast using the football hold position, LC asked mom wait until infant's mouth is wide with nose and chin touching breast, nostril free, infant latched with swallows heard for 11 minutes. LC discussed hand expression and mom taught back giving infant 7 mls of colostrum by spoon. Mom knows to breastfeed infant by hunger cues, 8 to 12+ times within 24 hours and not to exceed 3 hours without breastfeeding infant. Due to infant being ETI, but latches well at breast,  mom will latch infant at breast and hand express after wards  every  feeding giving infant extra volume, infant be re-assess  In morning if mom would need to  use DEBP.  Mom knows to do STS as much as possible with infant.  Reviewed Baby & Me book's Breastfeeding Basics.  Mom made aware of O/P services, breastfeeding support groups, community resources, and our phone # for post-discharge questions.  Maternal Data Formula Feeding for Exclusion: No Has patient been taught Hand Expression?: Yes Does the patient have breastfeeding experience prior to this delivery?:  Yes  Feeding Feeding Type: Breast Fed  LATCH Score Latch: Grasps breast easily, tongue down, lips flanged, rhythmical sucking.  Audible Swallowing: Spontaneous and intermittent  Type of Nipple: Everted at rest and after stimulation  Comfort (Breast/Nipple): Soft / non-tender  Hold (Positioning): Assistance needed to correctly position infant at breast and maintain latch.  LATCH Score: 9  Interventions Interventions: Breast feeding basics reviewed;Breast compression;Assisted with latch;Adjust position;Skin to skin;Support pillows;Breast massage;Position options;Hand express;Expressed milk  Lactation Tools Discussed/Used WIC Program: Yes   Consult Status Consult Status: Follow-up Date: 07/27/19 Follow-up type: In-patient    Danelle Earthly 07/27/2019, 3:14 AM

## 2019-07-27 NOTE — Anesthesia Postprocedure Evaluation (Signed)
Anesthesia Post Note  Patient: Kayla Mcdowell  Procedure(s) Performed: AN AD HOC LABOR EPIDURAL     Patient location during evaluation: Mother Baby Anesthesia Type: Epidural Level of consciousness: awake and alert Pain management: pain level controlled Vital Signs Assessment: post-procedure vital signs reviewed and stable Respiratory status: spontaneous breathing, nonlabored ventilation and respiratory function stable Cardiovascular status: stable Postop Assessment: no headache, no backache and epidural receding Anesthetic complications: no    Last Vitals:  Vitals:   07/27/19 0234 07/27/19 0601  BP: 134/65 127/69  Pulse: 82 71  Resp: 18 18  Temp:  36.5 C  SpO2: 100% 96%    Last Pain:  Vitals:   07/27/19 0745  TempSrc:   PainSc: 6    Pain Goal:                   Blythe Stanford

## 2019-07-27 NOTE — Progress Notes (Signed)
Patient ID: Yarden Manuelito, female   DOB: [birth year], 29 y.o.   MRN: [id number] PPD # 1 S/P NSVD  Live born female  Birth Weight: 5 lb 14.9 oz (2690 g) APGAR: 7, 9  Newborn Delivery   Birth date/time: 07/26/2019 23:06:00 Delivery type: Vaginal, Spontaneous     Baby name: Vara Delivering provider: Noland Fordyce  Episiotomy:None   Lacerations:None   Feeding: breast  Pain control at delivery: Epidural   S:  Reports feeling well.             Tolerating po/ No nausea or vomiting             Bleeding is "normal"             Pain controlled with acetaminophen and ibuprofen (OTC)             Up ad lib / ambulatory / voiding without difficulties   O:  A & O x 3, in no apparent distress              VS:  Vitals:   07/27/19 0133 07/27/19 0234 07/27/19 0601 07/27/19 1000  BP: 131/68 134/65 127/69 106/89  Pulse: 78 82 71 85  Resp: 18 18 18 17   Temp: 98.3 F (36.8 C)  97.7 F (36.5 C)   TempSrc: Oral  Oral   SpO2: 98% 100% 96% 99%  Weight:      Height:        LABS:  Recent Labs    07/26/19 0829 07/27/19 0018  WBC 7.8 16.3*  HGB 12.8 12.7  HCT 39.1 38.1  PLT 214 196   CBG (last 3)  Recent Labs    07/26/19 1733 07/26/19 2118 07/27/19 0606  GLUCAP 75 72 121*    Blood type: --/--/O POS, O POS Performed at [facility], [address], [place], [place]  [phone] (80/99)  Rubella: Immune (10/19 0000)   I&O: I/O last 3 completed shifts: In: -  Out: 500 [Urine:400; Blood:100]            Vaccines: TDaP UTD         Flu    UTD   Lungs: Clear and unlabored  Heart: regular rate and rhythm / no murmurs  Abdomen: soft, non-tender, non-distended             Fundus: firm, non-tender, U-1  Perineum: intact, no edema  Lochia: moderate  Extremities: trace pedal edema, no calf pain or tenderness    A/P: PPD # 1 29 y.o., G2P1   Principal Problem:   PP care - NSVB 6/7 Active Problems:   Anxiety   Encounter for induction of labor   GDM, class A2  -  controlled with Lantuss in pregnancy, will monitor BS QID postpartum and may consider metformin management at discharge with close F/U in office. Patient advised no food after midnight tonight in order to obtain accurate fasting BG in am.  Doing well - stable status  Routine post partum orders  Anticipate discharge tomorrow    8/7, MSN, CNM 07/27/2019, 11:03 AM

## 2019-07-27 NOTE — Progress Notes (Signed)
Blood sugar this AM = 121. Pt had eaten a [place] sandwich box after delivery and finished a sprite, she stated "about an hour ago" prior to CBG.

## 2019-07-27 NOTE — Progress Notes (Signed)
RN advised patient that we needed to check 2 hour PP so she would need to call out at the start of her meals. Pt. didn't eat breakfast but ate an early lunch approximately 1145. 2 hr PP checked at 1346 and resulted as 95. RN advised mom again to call out once she got her next meal. Mom did not call out, however, RN entered the room and found her about to eat approximately 1845. RN informed mom that we will check a 2hr PP at 2045. Mom agreed with plan of care. Will update night RN.

## 2019-07-27 NOTE — Lactation Note (Signed)
This note was copied from a baby's chart. Lactation Consultation Note  Patient Name: Kayla Mcdowell [id number] Date: 07/27/2019 Reason for consult: Follow-up assessment;Early term 37-38.6wks;Infant weight loss;Infant < 6lbs Type of Endocrine Disorder?: Diabetes  Baby is 17 hours old , 2nd LC visit for when the baby is more awake , to set up the DEBP for post pumping and shells due to semi compressible areolas.  Baby wide awake when LC entered the room and rooting with hiccups.  LC offered to assist to latch and check the right areola to see if it was more compressible compared to the left. The Left is more compressible and easier to compress.  LC placed baby STS on the right breast football with firm support. After several attempts baby latched with depth and after several sucks and swallows the hiccups disappeared and the baby was still feeding at 19 mins. [name] RN Cannon Kettle is aware to document today time) .  Per mom feels comfortable at the breast , just cramping.  LC mentioned to mom the next time she is breast feeding empty bladder 1st and the cramping  May not be as strong.  LC instructed mom on the use of breast shells between feedings except when sleeping.  Also the DEBP for post pumping after 4-5 feedings both breast fro 15 -20 mins since mom is hand expressing well . LC praised mom for how well she is doing with hand expressing and latching.  LC reassured mom the latching will get more consistent and easier once the areola edema  Improves.  LC provided the hand for LPT due to her baby being ET / less than 6 pounds and explained their Potential feeding behavior.   Maternal Data Has patient been taught Hand Expression?: Yes  Feeding Feeding Type: Breast Fed  LATCH Score Latch: Repeated attempts needed to sustain latch, nipple held in mouth throughout feeding, stimulation needed to elicit sucking reflex.  Audible Swallowing: Spontaneous and intermittent  Type of Nipple:  Everted at rest and after stimulation(areola edema)  Comfort (Breast/Nipple): Soft / non-tender  Hold (Positioning): Assistance needed to correctly position infant at breast and maintain latch.  LATCH Score: 8  Interventions Interventions: Breast feeding basics reviewed;Assisted with latch;Skin to skin;Breast massage;Hand express;Reverse pressure;Breast compression;Adjust position;Support pillows;Position options;Expressed milk;Shells;Hand pump;DEBP  Lactation Tools Discussed/Used Tools: Shells;Pump;Flanges Flange Size: 24;27 Shell Type: Inverted Breast pump type: Double-Electric Breast Pump;Manual WIC Program: Yes Pump Review: Setup, frequency, and cleaning;Milk Storage Initiated by:: MAI Date initiated:: 07/27/19   Consult Status Consult Status: Follow-up Date: 07/28/19 Follow-up type: In-patient    [name] 07/27/2019, 4:29 PM

## 2019-07-27 NOTE — H&P (Signed)
Kayla Mcdowell is a 30 y.o. 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 Pt notes rare contractions. Good fetal movement, No vaginal bleeding, not leaking fluid. Pt notes over the past week increase in HA, diastolic bp's into the low 90s on 2 visits last week and decision to proceed with IOL. Labs 6/3 negative for PEC.     Pt admitted in am, pit 2x2 with PCN started. Slow to achieve active labor, increase in ctx after AROM and  Epidural placed.      [facility] at [facility] since 7 wks  - Dated by LMP c/w 7 wk u/s  - 2VC, nl fetal echo, nl fetal growth  - h/o GDM, early DS of 115, at 28 wks was unable to tolerate GTT due to emesis, started checkin home BS, elevated values, mostly FBS and slow titration of Lantus up to 28 untils qhs. A1/c on 07/22/19 was 5.6.   - Zoloft for anxiety/depression  -prior child with genetic based premature adrenarche, developed at age 12, normal genetic screening. Low risk Panorama  - GBS positive.          Prenatal Transfer Tool    Maternal Diabetes: Yes:  Diabetes Type:  Insulin/Medication controlled  Genetic Screening: Normal  Maternal Ultrasounds/Referrals: Normal  Fetal Ultrasounds or other Referrals:  Fetal echo  Maternal Substance Abuse:  No  Significant Maternal Medications:  None  Significant Maternal Lab Results: Group B Strep positive                       OB History            Gravida    2        Para    1        Term             Preterm             AB             Living                   SAB             TAB             Ectopic             Multiple             Live Births                                 Past Medical History:    Diagnosis   Date    .   Anxiety        .   Gestational diabetes            insulin             Past Surgical History:     Procedure   Laterality   Date    .   TONSILLECTOMY                HAD DONE AT 1 YEARS       Family History: family history includes Hypertension in her maternal grandmother.  Social History:  reports that she has been smoking cigarettes. She has been smoking about 0.50 packs per day. She has never used smokeless tobacco. She reports current alcohol use. She reports that she  does not use drugs.     Review of Systems - Negative except discomfort of pregnancy        Physical Exam:    Vitals with BMI   07/26/2019   07/26/2019   07/26/2019    Height   -   -   -    Weight   -   -   -    BMI   -   -   -    Systolic   124   580   998    Diastolic   48   72   47    Pulse   75   71   71       Toco: IUPC placed, q 3 min, MVU adequate  Cvx: 3/60%/vtx -2 per CNM     Prenatal labs:  ABO, Rh: O/Positive/-- (10/19 0000)  Antibody: Negative (10/19 0000)  Rubella: Immune (10/19 0000)  RPR: Nonreactive (10/19 0000)   HBsAg: Negative (10/19 0000)   HIV: Non-reactive (10/19 0000)   GBS: Positive/-- (05/18 0000)   1 hr Glucola early DS 115, unable to tolerated 1 hr GTT at 28 wks    Genetic screening normal Panorama  Anatomy US normal        Assessment/Plan: 29 y.o. G2P1 at [redacted]w[redacted]d.  - gest htn. Plan IOL due to increasing HA and dbp climbing into 90s. Plan PIH labs on admit, follow bps closely  -IDDM, GDM. Pt instructed to decrease pm Lantus on night prior to admission, will follow BS q2hr while in active labor  - prior SVD, plan pit 2x2, AROM when able  - alert peds to baby risk of premature adrenarche  - GBS pos, plan PCN with labor  - slow to achieve active labor, adequate MVU but given only 3 cm will cont pitocin to work toward vaginal delivery in this multiparous patient.     Ala Dach  07/26/2019 7:40 PM

## 2019-07-27 NOTE — Progress Notes (Signed)
MOB was referred for history of depression/anxiety. * Referral screened out by Clinical Social Worker because none of the following criteria appear to apply: ~ History of anxiety/depression during this pregnancy, or of post-partum depression following prior delivery. ~ Diagnosis of anxiety and/or depression within last 3 years. Per further chart review, MOB diagnosed with anxiety/depression in 2009.  OR * MOB's symptoms currently being treated with medication and/or therapy.    Please contact the Clinical Social Worker if needs arise, by [facility] request, or if MOB scores greater than 9/yes to question 10 on Edinburgh Postpartum Depression Screen.    Claude Manges Bobie Caris, MSW, LCSW [facility] [place] [phone]

## 2019-07-28 ENCOUNTER — Encounter (HOSPITAL_COMMUNITY): Payer: Self-pay | Admitting: *Deleted

## 2019-07-28 LAB — GLUCOSE, CAPILLARY
Glucose-Capillary: 80 mg/dL (ref 70–99)
Glucose-Capillary: 135 mg/dL — ABNORMAL HIGH (ref 70–99)

## 2019-07-28 MED ORDER — IBUPROFEN 600 MG PO TABS: 600.0000 mg | ORAL_TABLET | Freq: Four times a day (QID) | ORAL | 0 refills | Status: AC

## 2019-07-28 MED ORDER — COCONUT OIL OIL: 1.0000 "application " | TOPICAL_OIL | 0 refills | Status: AC | PRN

## 2019-07-28 MED ORDER — ACETAMINOPHEN 500 MG PO TABS
1000.0000 mg | ORAL_TABLET | Freq: Four times a day (QID) | ORAL | 2 refills | Status: AC | PRN
Start: 2019-07-28 — End: 2020-07-27

## 2019-07-28 MED ORDER — PRENATAL MULTIVITAMIN CH: 1.0000 | ORAL_TABLET | Freq: Every day | ORAL | Status: AC

## 2019-07-28 NOTE — Discharge Summary (Addendum)
OB Discharge Summary  Patient Name: Kayla Mcdowell DOB: 08-Dec-1990 MRN: [id number]  Date of admission: 07/26/2019 Delivering provider: Aloha Gell   Admitting diagnosis: Encounter for induction of labor [Z34.90] Intrauterine pregnancy: [redacted]w[redacted]d     Secondary diagnosis: Patient Active Problem List   Diagnosis Date Noted  . PP care - NSVB 6/7 07/27/2019  . GDM, class A2 07/27/2019  . Encounter for induction of labor 07/26/2019  . Anxiety 05/01/2007  . DEPRESSION 05/01/2007  . IRRITABLE BOWEL SYNDROME 05/01/2007   Additional problems: A2GDM  Date of discharge: 07/28/2019   Discharge diagnosis: Principal Problem:   PP care - NSVB 6/7 Active Problems:   Anxiety   Encounter for induction of labor   GDM, class A2                                                            Post partum procedures:None  Augmentation: AROM and Pitocin Pain control: Epidural  Laceration:None  Episiotomy:None  Complications: None  Hospital course:  Onset of Labor With Vaginal Delivery      29 y.o. yo G2P1 at [redacted]w[redacted]d was admitted in Latent Labor on 07/26/2019. Patient had an uncomplicated labor course as follows:  Membrane Rupture Time/Date: 1:30 PM ,07/26/2019   Delivery Method:Vaginal, Spontaneous  Episiotomy: None  Lacerations:  None  Patient had an uncomplicated postpartum course.  She is ambulating, tolerating a regular diet, passing flatus, and urinating well. Patient is discharged home in stable condition on 07/28/19.  Newborn Data: Birth date:07/26/2019  Birth time:11:06 PM  Gender:Female  Living status:Living  Apgars:7 ,9  Weight:2690 g   Physical exam  Vitals:   07/27/19 1000 07/27/19 1343 07/27/19 2053 07/28/19 0543  BP: 106/89 122/78 119/69 130/88  Pulse: 85 73 74 67  Resp: 17 18 18 17   Temp:  97.7 F (36.5 C) 98.9 F (37.2 C) (!) 97.5 F (36.4 C)  TempSrc:  Oral Oral Oral  SpO2: 99% 99% 100% 100%  Weight:      Height:       General: alert, cooperative and no distress Lochia:  appropriate Uterine Fundus: firm Incision: N/A DVT Evaluation: No evidence of DVT seen on physical exam. Labs: Lab Results  Component Value Date   WBC 16.3 (H) 07/27/2019   HGB 12.7 07/27/2019   HCT 38.1 07/27/2019   MCV 92.9 07/27/2019   PLT 196 07/27/2019   CMP Latest Ref Rng & Units 07/26/2019  Glucose 70 - 99 mg/dL 108(H)  BUN 6 - 20 mg/dL 14  Creatinine 0.44 - 1.00 mg/dL 0.88  Sodium 135 - 145 mmol/L 135  Potassium 3.5 - 5.1 mmol/L 4.7  Chloride 98 - 111 mmol/L 107  CO2 22 - 32 mmol/L 19(L)  Calcium 8.9 - 10.3 mg/dL 9.2  Total Protein 6.5 - 8.1 g/dL 4.9(L)  Total Bilirubin 0.3 - 1.2 mg/dL 0.5  Alkaline Phos 38 - 126 U/L 92  AST 15 - 41 U/L 38  ALT 0 - 44 U/L 16   Edinburgh Postnatal Depression Scale Screening Tool 07/28/2019 07/27/2019 07/27/2019  I have been able to laugh and see the funny side of things. 0 (No Data) (No Data)  I have looked forward with enjoyment to things. 0 - -  I have blamed myself unnecessarily when things went wrong. 2 - -  I have  been anxious or worried for no good reason. 2 - -  I have felt scared or panicky for no good reason. 0 - -  Things have been getting on top of me. 1 - -  I have been so unhappy that I have had difficulty sleeping. 0 - -  I have felt sad or miserable. 1 - -  I have been so unhappy that I have been crying. 1 - -  The thought of harming myself has occurred to me. 0 - -  Edinburgh Postnatal Depression Scale Total 7 - -   Vaccines: TDaP UTD         Flu    UTD  Discharge instruction:  per After Visit Summary,  [facility] booklet and  "Understanding Mother & Baby Care" hospital booklet  After Visit Meds:  Allergies as of 07/28/2019   No Known Allergies     Medication List    STOP taking these medications   doxycycline 100 MG capsule Commonly known as: VIBRAMYCIN   fluconazole 150 MG tablet Commonly known as: DIFLUCAN   fluconazole 200 MG tablet Commonly known as: DIFLUCAN   ketorolac 10 MG tablet Commonly known  as: TORADOL   Lantus SoloStar 100 UNIT/ML Solostar Pen Generic drug: insulin glargine   Magnesium 200 MG Tabs   meloxicam 15 MG tablet Commonly known as: Mobic   methocarbamol 500 MG tablet Commonly known as: ROBAXIN   metroNIDAZOLE 0.75 % vaginal gel Commonly known as: METROGEL VAGINAL   valACYclovir 500 MG tablet Commonly known as: VALTREX     TAKE these medications   acetaminophen 500 MG tablet Commonly known as: TYLENOL Take 2 tablets (1,000 mg total) by mouth every 6 (six) hours as needed.   coconut oil Oil Apply 1 application topically as needed.   ibuprofen 600 MG tablet Commonly known as: ADVIL Take 1 tablet (600 mg total) by mouth every 6 (six) hours.   pantoprazole 40 MG tablet Commonly known as: PROTONIX Take 40 mg by mouth daily.   prenatal multivitamin Tabs tablet Take 1 tablet by mouth daily at 12 noon. What changed:   medication strength  when to take this   sertraline 50 MG tablet Commonly known as: ZOLOFT Take 100 mg by mouth daily.            Discharge Care Instructions  (From admission, onward)         Start     Ordered   07/28/19 0000  Discharge wound care:    Comments: Sitz baths 2 times /day with warm water x 1 week. May add herbals: 1 ounce dried comfrey leaf* 1 ounce calendula flowers 1 ounce lavender flowers 1/2 ounce dried uva ursi leaves 1/2 ounce witch hazel blossoms (if you can find them) 1/2 ounce dried sage leaf 1/2 cup sea salt Directions: Bring 2 quarts of water to a boil. Turn off heat, and place 1 ounce (approximately 1 large handful) of the above mixed herbs (not the salt) into the pot. Steep, covered, for 30 minutes.  Strain the liquid well with a fine mesh strainer, and discard the herb material. Add 2 quarts of liquid to the tub, along with the 1/2 cup of salt. This medicinal liquid can also be made into compresses and peri-rinses.   07/28/19 0843         Diet: routine diet  Activity: Advance as  tolerated. Pelvic rest for 6 weeks.   Postpartum contraception: Not Discussed  Newborn Data: Live born female  Birth  Weight: 5 lb 14.9 oz (2690 g) APGAR: 7, 9  Newborn Delivery   Birth date/time: 07/26/2019 23:06:00 Delivery type: Vaginal, Spontaneous      named Vera Baby Feeding: Breast Disposition:home with mother  Delivery Report:   Review the Delivery Report for details.    Follow up: Follow-up Information    [facility], [facility]. Schedule an appointment as soon as possible for a visit in 1 week(s).   Why: Please bring blood sugar log.  Contact information: [address] [place] [address] [phone]          Signed: Clancy Gourd, MSN 07/28/2019, 9:54 PM

## 2019-07-28 NOTE — Lactation Note (Signed)
This note was copied from a baby's chart. Lactation Consultation Note  Patient Name: Kayla Mcdowell MVEHM'C Date: 07/28/2019 Reason for consult: Follow-up assessment;Early term 37-38.6wks;Infant weight loss(7 % weight loss) Type of Endocrine Disorder?: Diabetes  Baby is 66 hours old, @ 30 hours - Bili check low  Baby awake and hungry.  LC offered to assist and check latch and mom receptive.  Baby latched on the right breast / football/ with depth and increased swallows  With breast compressions. Baby fed for 14 mins.  LC noted mom to have less areola edema and baby to increase depth at the breast compared to yesterday.  Per mom breast are feeling fuller. Denies sore nipples . Sore nipple and engorgement reviewed.  Mom has the shells , has not used them . LC mentioned when her breast are fuller she may find them useful to keep the areolas compressible for depth.  LC discussed nutritive vs non- nutritive feeding patterns and the importance of watching for the baby being non -nutritive , hanging out latched.  LC stressed the importance of STS feedings until the baby is back to birth weight, gaining steadily and can stay awake for majority of the feeding.  Per mom has DEBP ( Medela ) at home. Has hand pump and the DEBP kit for D/C and shells.  Mom has the pamphlet with phone numbers.       Feeding Feeding Type: Breast Fed  LATCH Score Latch: Grasps breast easily, tongue down, lips flanged, rhythmical sucking.  Audible Swallowing: Spontaneous and intermittent  Type of Nipple: Everted at rest and after stimulation  Comfort (Breast/Nipple): Filling, red/small blisters or bruises, mild/mod discomfort  Hold (Positioning): Assistance needed to correctly position infant at breast and maintain latch.  LATCH Score: 8  Interventions Interventions: Breast feeding basics reviewed;Assisted with latch;Skin to skin;Breast massage;Breast compression;Adjust position;Support pillows;Position  options;Expressed milk;Shells;Hand pump;DEBP  Lactation Tools Discussed/Used Tools: Shells;Pump Flange Size: 24;27 Shell Type: Inverted Breast pump type: Manual;Double-Electric Breast Pump Pump Review: Milk Storage   Consult Status Consult Status: Complete Date: 07/28/19    Myer Haff 07/28/2019, 10:41 AM
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
## 2019-08-04 ENCOUNTER — Inpatient Hospital Stay (HOSPITAL_COMMUNITY): Payer: Medicaid Other

## 2019-08-04 NOTE — Addendum Note (Signed)
Addendum  created 08/04/19 1505 by Cecile Hearing, MD   Intraprocedure Staff edited

## 2019-11-09 ENCOUNTER — Encounter: Payer: Medicaid Other | Admitting: Women's Health

## 2019-11-30 ENCOUNTER — Encounter: Payer: Self-pay | Admitting: General Practice

## 2019-11-30 ENCOUNTER — Telehealth: Payer: Self-pay | Admitting: General Practice

## 2019-11-30 NOTE — Telephone Encounter (Signed)
Patient left message wanting to establish care.  Called patient to schedule, but no answer.  Left message on VM for pt to give our office a call.

## 2019-12-30 ENCOUNTER — Encounter: Payer: Medicaid Other | Admitting: Obstetrics and Gynecology

## 2020-02-17 ENCOUNTER — Ambulatory Visit (INDEPENDENT_AMBULATORY_CARE_PROVIDER_SITE_OTHER): Payer: Medicaid Other | Admitting: Certified Nurse Midwife

## 2020-02-17 ENCOUNTER — Other Ambulatory Visit: Payer: Self-pay

## 2020-02-17 ENCOUNTER — Other Ambulatory Visit (HOSPITAL_COMMUNITY)
Admission: RE | Admit: 2020-02-17 | Discharge: 2020-02-17 | Disposition: A | Discharge status: Home / Self Care | Payer: Medicaid Other | Source: Ambulatory Visit | Attending: Obstetrics and Gynecology | Admitting: Obstetrics and Gynecology

## 2020-02-17 ENCOUNTER — Encounter: Payer: Self-pay | Admitting: Certified Nurse Midwife

## 2020-02-17 VITALS — BP 108/78 | HR 92 | Temp 98.7°F | Ht 64.5 in | Wt 279.6 lb

## 2020-02-17 DIAGNOSIS — F53 Postpartum depression: Secondary | ICD-10-CM

## 2020-02-17 DIAGNOSIS — Z01419 Encounter for gynecological examination (general) (routine) without abnormal findings: Secondary | ICD-10-CM

## 2020-02-17 DIAGNOSIS — Z3009 Encounter for other general counseling and advice on contraception: Secondary | ICD-10-CM | POA: Diagnosis not present

## 2020-02-17 DIAGNOSIS — O99345 Other mental disorders complicating the puerperium: Secondary | ICD-10-CM | POA: Diagnosis not present

## 2020-02-17 LAB — POCT URINE PREGNANCY: Preg Test, Ur: NEGATIVE

## 2020-02-17 NOTE — Progress Notes (Signed)
GYNECOLOGY CLINIC ANNUAL PREVENTATIVE CARE ENCOUNTER NOTE  Subjective:   Kayla Mcdowell is a 29 y.o. G42P2002 female here for a routine annual gynecologic exam.  Current complaints: None, desires IUD placement but cycle is due and last unprotected IC was last week. Pt expresses feelings of anxiety and depression centered around mothering. She is taking Zoloft and Wellbutrin but is not seeing a therapist. Denies abnormal vaginal bleeding, discharge, pelvic pain, problems with intercourse or other gynecologic concerns.    Gynecologic History Patient's last menstrual period was 01/17/2020. Contraception: none Last Pap: Unknown. Results were: normal per patient  Obstetric History OB History  Gravida Para Term Preterm AB Living  2 2 2     2   SAB IAB Ectopic Multiple Live Births          2    # Outcome Date GA Lbr Len/2nd Weight Sex Delivery Anes PTL Lv  2 Term 07/26/19 [redacted]w[redacted]d  5 lb 6 oz (2.438 kg) F Vag-Spont EPI N LIV     Complications: Gestational diabetes, Gestational hypertension  1 Term 03/17/15 [redacted]w[redacted]d  6 lb 8 oz (2.948 kg) F Vag-Spont EPI N LIV     Complications: Gestational diabetes    Past Medical History:  Diagnosis Date  . Anxiety   . Depression   . Gestational diabetes    insulin  . HSV (herpes simplex virus) anogenital infection     Past Surgical History:  Procedure Laterality Date  . TONSILLECTOMY     HAD DONE AT 1 YEARS    Current Outpatient Medications on File Prior to Visit  Medication Sig Dispense Refill  . acetaminophen (TYLENOL) 500 MG tablet Take 2 tablets (1,000 mg total) by mouth every 6 (six) hours as needed. 100 tablet 2  . buPROPion (WELLBUTRIN) 100 MG tablet Take 100 mg by mouth daily at 6 (six) AM.    . sertraline (ZOLOFT) 50 MG tablet Take 100 mg by mouth daily.    . coconut oil OIL Apply 1 application topically as needed. (Patient not taking: Reported on 02/17/2020)  0  . ibuprofen (ADVIL) 600 MG tablet Take 1 tablet (600 mg total) by mouth  every 6 (six) hours. (Patient not taking: Reported on 02/17/2020) 30 tablet 0  . pantoprazole (PROTONIX) 40 MG tablet Take 40 mg by mouth daily. (Patient not taking: Reported on 02/17/2020)    . Prenatal Vit-Fe Fumarate-FA (PRENATAL MULTIVITAMIN) TABS tablet Take 1 tablet by mouth daily at 12 noon. (Patient not taking: Reported on 02/17/2020)     No current facility-administered medications on file prior to visit.    No Known Allergies  Social History   Socioeconomic History  . Marital status: Single    Spouse name: Not on file  . Number of children: Not on file  . Years of education: Not on file  . Highest education level: Not on file  Occupational History  . Not on file  Tobacco Use  . Smoking status: Former Smoker    Packs/day: 0.50    Types: Cigarettes  . Smokeless tobacco: Never Used  Vaping Use  . Vaping Use: Never used  Substance and Sexual Activity  . Alcohol use: Yes    Comment: once a month  . Drug use: No  . Sexual activity: Yes    Birth control/protection: None    Comment: SKYLA  Other Topics Concern  . Not on file  Social History Narrative  . Not on file   Social Determinants of Health   Financial Resource  Strain: Not on file  Food Insecurity: Not on file  Transportation Needs: Not on file  Physical Activity: Not on file  Stress: Not on file  Social Connections: Not on file  Intimate Partner Violence: Not on file    Family History  Problem Relation Age of Onset  . Hypertension Maternal Grandmother     The following portions of the patient's history were reviewed and updated as appropriate: allergies, current medications, past family history, past medical history, past social history, past surgical history and problem list.  Review of Systems Pertinent items noted in HPI and remainder of comprehensive ROS otherwise negative.   Objective:  BP 108/78 (BP Location: Left Arm, Patient Position: Sitting, Cuff Size: Large)   Pulse 92   Temp 98.7 F  (37.1 C) (Oral)   Ht 5' 4.5" (1.638 m)   Wt 279 lb 9.6 oz (126.8 kg)   LMP 01/17/2020   Breastfeeding No   BMI 47.25 kg/m  CONSTITUTIONAL: Well-developed, well-nourished female in no acute distress.  HENT:  Normocephalic, atraumatic, External right and left ear normal. Oropharynx is clear and moist EYES: Conjunctivae and EOM are normal. Pupils are equal, round, and reactive to light. No scleral icterus.  NECK: Normal range of motion, supple, no masses.  Normal thyroid.  SKIN: Skin is warm and dry. No rash noted. Not diaphoretic. No erythema. No pallor. NEUROLGIC: Alert and oriented to person, place, and time. Normal reflexes, muscle tone coordination. No cranial nerve deficit noted. PSYCHIATRIC: Normal mood and affect. Normal behavior. Normal judgment and thought content. CARDIOVASCULAR: Normal heart rate noted, regular rhythm RESPIRATORY: Clear to auscultation bilaterally. Effort and breath sounds normal, no problems with respiration noted. BREASTS: Symmetric in size. No masses, skin changes, nipple drainage, or lymphadenopathy. ABDOMEN: Soft, normal bowel sounds, no distention noted.  No tenderness, rebound or guarding.  PELVIC: Normal appearing external genitalia; normal appearing vaginal mucosa and cervix.  No abnormal discharge noted.  Pap smear obtained.  Normal uterine size, no other palpable masses, no uterine or adnexal tenderness. MUSCULOSKELETAL: Normal range of motion. No tenderness.  No cyanosis, clubbing, or edema.  2+ distal pulses.  Assessment:  Annual gynecologic examination with pap smear   Plan:  Will follow up results of pap smear and manage accordingly. Pt amenable to social work referral, referral made Routine preventative health maintenance measures emphasized. Please refer to After Visit Summary for other counseling recommendations.  Pt to return in two weeks for IUD placement (prefers [facility])  Dan Humphreys, Judye Bos, [place]

## 2020-02-24 ENCOUNTER — Encounter: Payer: Medicaid Other | Admitting: Obstetrics and Gynecology

## 2020-03-02 ENCOUNTER — Ambulatory Visit: Payer: Medicaid Other | Admitting: Obstetrics and Gynecology

## 2020-03-02 ENCOUNTER — Telehealth: Payer: Medicaid Other | Admitting: Licensed Clinical Social Worker

## 2020-03-08 LAB — CYTOLOGY - PAP
Adequacy: ABSENT
Chlamydia: NEGATIVE
Comment: NEGATIVE
Comment: NEGATIVE
Comment: NEGATIVE
Comment: NEGATIVE
Comment: NORMAL
Diagnosis: NEGATIVE
HPV 16: NEGATIVE
HPV 18 / 45: NEGATIVE
High risk HPV: POSITIVE — AB
Neisseria Gonorrhea: NEGATIVE
Trichomonas: NEGATIVE

## 2020-06-01 ENCOUNTER — Ambulatory Visit: Payer: Medicaid Other | Admitting: Obstetrics and Gynecology

## 2020-06-21 ENCOUNTER — Ambulatory Visit (INDEPENDENT_AMBULATORY_CARE_PROVIDER_SITE_OTHER): Payer: Medicaid Other | Admitting: Obstetrics and Gynecology

## 2020-06-21 ENCOUNTER — Other Ambulatory Visit: Payer: Self-pay

## 2020-06-21 ENCOUNTER — Encounter: Payer: Self-pay | Admitting: Obstetrics and Gynecology

## 2020-06-21 VITALS — BP 113/79 | HR 81 | Temp 98.1°F | Ht 64.5 in | Wt 273.2 lb

## 2020-06-21 DIAGNOSIS — Z3202 Encounter for pregnancy test, result negative: Secondary | ICD-10-CM | POA: Diagnosis not present

## 2020-06-21 DIAGNOSIS — Z30430 Encounter for insertion of intrauterine contraceptive device: Secondary | ICD-10-CM | POA: Diagnosis not present

## 2020-06-21 LAB — POCT URINE PREGNANCY: Preg Test, Ur: NEGATIVE

## 2020-06-21 MED ORDER — LEVONORGESTREL 13.5 MG IU IUD
INTRAUTERINE_SYSTEM | Freq: Once | INTRAUTERINE | Status: AC
  Administered 2020-06-21: 13.5 mg via INTRAUTERINE

## 2020-06-21 NOTE — Patient Instructions (Signed)

## 2020-06-21 NOTE — Progress Notes (Signed)
   GYNECOLOGY CLINIC PROCEDURE NOTE  Kayla Mcdowell is a 30 y.o. [id number] here for [facility] IUD insertion. No GYN concerns.  Last pap smear was on 02/17/20 and was abnormal.  IUD Insertion Procedure Note Patient identified, informed consent performed, consent signed.   Discussed risks of irregular bleeding, cramping, infection, malpositioning or misplacement of the IUD outside the uterus which may require further procedure such as laparoscopy. Time out was performed.  Urine pregnancy test negative.  Speculum placed in the vagina.  Cervix visualized.  Cleaned with Betadine x 2.  Grasped anteriorly with a single tooth tenaculum.  Uterus sounded to 7 cm.  Skyla  IUD placed per manufacturer's recommendations.  Strings trimmed to 3 cm. Tenaculum was removed, good hemostasis noted.  Patient tolerated procedure well.   Patient was given post-procedure instructions.  She was advised to have backup contraception for one week.  Patient was also asked to check IUD strings periodically and follow up in 4 weeks for IUD check.  Duane Lope, NP 06/21/2020 4:29 PM

## 2020-07-21 ENCOUNTER — Ambulatory Visit: Payer: Medicaid Other

## 2020-07-28 ENCOUNTER — Ambulatory Visit: Payer: Medicaid Other

## 2020-09-15 ENCOUNTER — Ambulatory Visit: Payer: Medicaid Other
